# Patient Record
Sex: Male | Born: 1990 | Race: White | Hispanic: No | Marital: Single | Smoking: Never smoker
Health system: Southern US, Community
[De-identification: ages and names within clinical notes are randomized; demographics above are authoritative.]

---

## 2011-03-02 ENCOUNTER — Emergency Department (HOSPITAL_COMMUNITY)
Admission: EM | Admit: 2011-03-02 | Discharge: 2011-03-02 | Disposition: A | Discharge status: Home / Self Care | Payer: Medicaid Other | Attending: Emergency Medicine | Admitting: Emergency Medicine

## 2011-03-02 DIAGNOSIS — K089 Disorder of teeth and supporting structures, unspecified: Secondary | ICD-10-CM | POA: Insufficient documentation

## 2011-03-22 ENCOUNTER — Emergency Department (HOSPITAL_COMMUNITY)
Admission: EM | Admit: 2011-03-22 | Discharge: 2011-03-22 | Disposition: A | Discharge status: Home / Self Care | Payer: Self-pay | Attending: Emergency Medicine | Admitting: Emergency Medicine

## 2011-03-22 DIAGNOSIS — K089 Disorder of teeth and supporting structures, unspecified: Secondary | ICD-10-CM | POA: Insufficient documentation

## 2014-03-17 ENCOUNTER — Emergency Department: Payer: Self-pay | Admitting: Emergency Medicine

## 2015-06-20 ENCOUNTER — Ambulatory Visit
Admission: EM | Admit: 2015-06-20 | Discharge: 2015-06-20 | Disposition: A | Discharge status: Home / Self Care | Payer: Worker's Compensation | Attending: Family Medicine | Admitting: Family Medicine

## 2015-06-20 ENCOUNTER — Ambulatory Visit (INDEPENDENT_AMBULATORY_CARE_PROVIDER_SITE_OTHER): Payer: Worker's Compensation

## 2015-06-20 DIAGNOSIS — S93402A Sprain of unspecified ligament of left ankle, initial encounter: Secondary | ICD-10-CM

## 2015-06-20 DIAGNOSIS — M79662 Pain in left lower leg: Secondary | ICD-10-CM | POA: Diagnosis not present

## 2015-06-20 MED ORDER — MELOXICAM 15 MG PO TABS: 15.0000 mg | ORAL_TABLET | Freq: Every day | ORAL | Status: AC | PRN

## 2015-06-20 MED ORDER — TRAMADOL HCL 50 MG PO TABS: 50.0000 mg | ORAL_TABLET | Freq: Three times a day (TID) | ORAL | Status: AC | PRN

## 2015-06-20 NOTE — ED Notes (Signed)
CAM walker applied as ordered.WCB paperwork given to patient

## 2015-06-20 NOTE — ED Notes (Signed)
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 + old bruising left outer foot/ankle

## 2015-06-20 NOTE — Discharge Instructions (Signed)
Take medication as prescribed. Use splint. Rest. Apply ice. Avoid strenuous activity.   As discussed, follow up with Tommie Maximiano Coss this coming week. See above to call.   Follow up with orthopedic as needed for continued pain as discussed. Return to Urgent care for new or worsening concerns.   Ankle Sprain An ankle sprain is an injury to the strong, fibrous tissues (ligaments) that hold the bones of your ankle joint together.  CAUSES An ankle sprain is usually caused by a fall or by twisting your ankle. Ankle sprains most commonly occur when you step on the outer edge of your foot, and your ankle turns inward. People who participate in sports are more prone to these types of injuries.  SYMPTOMS   Pain in your ankle. The pain may be present at rest or only when you are trying to stand or walk.  Swelling.  Bruising. Bruising may develop immediately or within 1 to 2 days after your injury.  Difficulty standing or walking, particularly when turning corners or changing directions. DIAGNOSIS  Your caregiver will ask you details about your injury and perform a physical exam of your ankle to determine if you have an ankle sprain. During the physical exam, your caregiver will press on and apply pressure to specific areas of your foot and ankle. Your caregiver will try to move your ankle in certain ways. An X-ray exam may be done to be sure a bone was not broken or a ligament did not separate from one of the bones in your ankle (avulsion fracture).  TREATMENT  Certain types of braces can help stabilize your ankle. Your caregiver can make a recommendation for this. Your caregiver may recommend the use of medicine for pain. If your sprain is severe, your caregiver may refer you to a surgeon who helps to restore function to parts of your skeletal system (orthopedist) or a physical therapist. HOME CARE INSTRUCTIONS   Apply ice to your injury for 1-2 days or as directed by your caregiver. Applying ice  helps to reduce inflammation and pain.  Put ice in a plastic bag.  Place a towel between your skin and the bag.  Leave the ice on for 15-20 minutes at a time, every 2 hours while you are awake.  Only take over-the-counter or prescription medicines for pain, discomfort, or fever as directed by your caregiver.  Elevate your injured ankle above the level of your heart as much as possible for 2-3 days.  If your caregiver recommends crutches, use them as instructed. Gradually put weight on the affected ankle. Continue to use crutches or a cane until you can walk without feeling pain in your ankle.  If you have a plaster splint, wear the splint as directed by your caregiver. Do not rest it on anything harder than a pillow for the first 24 hours. Do not put weight on it. Do not get it wet. You may take it off to take a shower or bath.  You may have been given an elastic bandage to wear around your ankle to provide support. If the elastic bandage is too tight (you have numbness or tingling in your foot or your foot becomes cold and blue), adjust the bandage to make it comfortable.  If you have an air splint, you may blow more air into it or let air out to make it more comfortable. You may take your splint off at night and before taking a shower or bath. Wiggle your toes in the splint  several times per day to decrease swelling. SEEK MEDICAL CARE IF:   You have rapidly increasing bruising or swelling.  Your toes feel extremely cold or you lose feeling in your foot.  Your pain is not relieved with medicine. SEEK IMMEDIATE MEDICAL CARE IF:  Your toes are numb or blue.  You have severe pain that is increasing. MAKE SURE YOU:   Understand these instructions.  Will watch your condition.  Will get help right away if you are not doing well or get worse.   This information is not intended to replace advice given to you by your health care provider. Make sure you discuss any questions you have  with your health care provider.   Document Released: 06/21/2005 Document Revised: 07/12/2014 Document Reviewed: 07/03/2011 Elsevier Interactive Patient Education [employer]

## 2015-06-20 NOTE — ED Provider Notes (Signed)
[facility]  ____________________________________________  Time seen: Approximately 12:10 PM  I have reviewed the triage vital signs and the nursing notes.   HISTORY  Chief Complaint Ankle Pain   HPI Daniel Rowe is a 24 y.o. male presents for the complaint of left lateral foot and ankle pain. Patient reports that this pain has been present for 1 week after an injury at work. Patient reports that he slipped off of a forklift while getting down and rolled left ankle. Denies head injury or loss consciousness. Denies neck or back pain or injury.  Patient reports that he has not been seen for this problem until today. Patient states that he tried to rest it at home to see if it would improve and has not improved which is why he is now seeking treatment. Patient reports that he has continued to walk even though with some pain. Patient states that current pain is 5 out of 10 aching. Patient states worse with movement and walking. Denies pain radiation. Also reports pain in back of ankle with pushing off to walk. States still able to fully move foot and ankle. Denies numbness or tingling sensation.  Denies head injury or loss of consciousness. Denies chest pain, shortness of breath, neck or back pain, or other extremity pain or injury.   States he frequently has to do a lot of moving, lifting and strenuous activity at work.  Reports this is a worker's compensation injury, reports he has several days off before next work shift.    History reviewed. No pertinent past medical history.  There are no active problems to display for this patient.   History reviewed. No pertinent past surgical history.  Current Outpatient Rx  Name  Route  Sig  Dispense  Refill  .           Marland Kitchen.             Allergies Review of patient's allergies indicates no known allergies.  History reviewed. No pertinent family history.  Social History Social History  Substance Use Topics  . Smoking status:  Never Smoker   . Smokeless tobacco: None  . Alcohol Use: No    Review of Systems Constitutional: No fever/chills Eyes: No visual changes. ENT: No sore throat. Cardiovascular: Denies chest pain. Respiratory: Denies shortness of breath. Gastrointestinal: No abdominal pain.  No nausea, no vomiting.  No diarrhea.  No constipation. Genitourinary: Negative for dysuria. Musculoskeletal: Negative for back pain. Left foot and ankle pain.  Skin: Negative for rash. Neurological: Negative for headaches, focal weakness or numbness.  10-point ROS otherwise negative.  ____________________________________________   PHYSICAL EXAM:  VITAL SIGNS: ED Triage Vitals  Enc Vitals Group     BP 06/20/15 1035 129/66 mmHg     Pulse Rate 06/20/15 1035 102 Recheck 94     Resp 06/20/15 1035 16     Temp 06/20/15 1035 98.2 F (36.8 C)     Temp Source 06/20/15 1035 Tympanic     SpO2 06/20/15 1035 100 %     Weight 06/20/15 1035 182 lb (82.555 kg)     Height 06/20/15 1035 5\' 10"  (1.778 m)     Head Cir --      Peak Flow --      Pain Score 06/20/15 1037 8     Pain Loc --      Pain Edu? --      Excl. in GC? --     Constitutional: Alert and oriented. Well appearing and in  no acute distress. Eyes: Conjunctivae are normal. PERRL. EOMI. Head: Atraumatic.No ecchymosis or tenderness.  Nose: No congestion/rhinnorhea.  Mouth/Throat: Mucous membranes are moist.  Oropharynx non-erythematous. Neck: No stridor.  No cervical spine tenderness to palpation. Cardiovascular: Normal rate, regular rhythm. Grossly normal heart sounds.  Good peripheral circulation. Respiratory: Normal respiratory effort.  No retractions. Lungs CTAB.  Gastrointestinal: Soft and nontender. = Musculoskeletal: No lower or upper extremity tenderness nor edema.  Bilateral pedal pulses equal and easily palpated.  No cervical, thoracic or lumbar tenderness to palpation. Except: Left lateral ankle mild to moderate tenderness to palpation, mild  tenderness to palpation left posterior ankle, mild healing ecchymosis to left lateral foot, full range of motion present to foot and ankle however with pain with ankle rotation and left foot dorsiflexion, ambulatory with mild antalgic gait, no pain when standing and going to tiptoes, negative Thompson's squeeze test bilaterally, cap refill less than 2 seconds to left distal toes, no sensation or motor deficit, left lower extremity otherwise nontender. Neurologic:  Normal speech and language. No gross focal neurologic deficits are appreciated. No gait instability. Skin:  Skin is warm, dry and intact. No rash noted. Psychiatric: Mood and affect are normal. Speech and behavior are normal.  ____________________________________________   LABS (all labs ordered are listed, but only abnormal results are displayed)  Labs Reviewed - No data to display __________________________________________  RADIOLOGY  EXAM: LEFT TIBIA AND FIBULA - 2 VIEW  COMPARISON: None.  FINDINGS: There is no evidence of fracture or other focal bone lesions. Soft tissues are unremarkable.  IMPRESSION: Negative.   Electronically Signed By: Marlan Palau M.D. On: 06/20/2015 11:54          DG Foot Complete Left (Final result) Result time: 06/20/15 11:55:19   Final result by Rad Results In Interface (06/20/15 11:55:19)   Narrative:   CLINICAL DATA: Injury. Leg pain and foot pain  EXAM: LEFT FOOT - COMPLETE 3+ VIEW  COMPARISON: None.  FINDINGS: There is no evidence of fracture or dislocation. There is no evidence of arthropathy or other focal bone abnormality. Soft tissues are unremarkable.  IMPRESSION: Negative.   Electronically Signed By: Marlan Palau M.D. On: 06/20/2015 11:55       I, Renford Dills, personally viewed and evaluated these images (plain radiographs) as part of my medical decision making.    Procedure:  Orthopedic walking boot applied by RN.  Neurovascular intact post application.  INITIAL IMPRESSION / ASSESSMENT AND PLAN / ED COURSE  Pertinent labs & imaging results that were available during my care of the patient were reviewed by me and considered in my medical decision making (see chart for details).  Very well-appearing patient. No acute distress. Presents for the complaint of left foot and ankle pain post mechanical injury at work this past week. Denies head injury or loss consciousness. Denies other pain or injury. Reports has tried to rest at home to see if pain resolves, but reports pain did not improve and presented today. 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 Complains of left lateral ankle and foot pain. Will evaluate by x-ray.  Left tibia fibula x-ray as well as left foot x-ray reviewed and negative. X-rays negative per radiologist.  Suspect strain injury. As patient has continued to remain ambulatory but with pain will apply walking boot for support and stability. Patient denies need for crutches. As patient does frequent lifting and moving heavy or items at work, concern that patient not may not be back to full physical ability  by time of his next shift. Recommend patient to apply ice, rest and elevate. Also will treat with when necessary Mobic and tramadol as needed for breakthrough pain. Discussed with patient in room, will refer patient to Kela Millin, FNP for further follow-up. Discussed patient to follow-up with Kela Millin prior to resuming full activity and also to help determine if orthopedic follow-up as indicated. Patient verbalized understanding and agreed to this plan and reports that he will follow-up with Kela Millin this coming week, information given. Patient to continue using walking boot and avoid strenuous activity involving left lower extremity until follow up.   Discussed follow up with Primary care physician this week. Discussed follow up and return parameters including no  resolution or any worsening concerns. Patient verbalized understanding and agreed to plan.  .____________________________________________   FINAL CLINICAL IMPRESSION(S) / ED DIAGNOSES  Final diagnoses:  Left ankle sprain, initial encounter  Pain of left lower leg       Renford Dills, NP 06/25/15 1611  Renford Dills, NP 06/25/15 1636

## 2016-11-10 IMAGING — CR DG FOOT COMPLETE 3+V*L*
4 series · 4 of 4 positions shown · non-contrast
Comparison: None.

CLINICAL DATA: Injury.  Leg pain and foot pain

EXAM:
LEFT FOOT - COMPLETE 3+ VIEW

[foot ap]
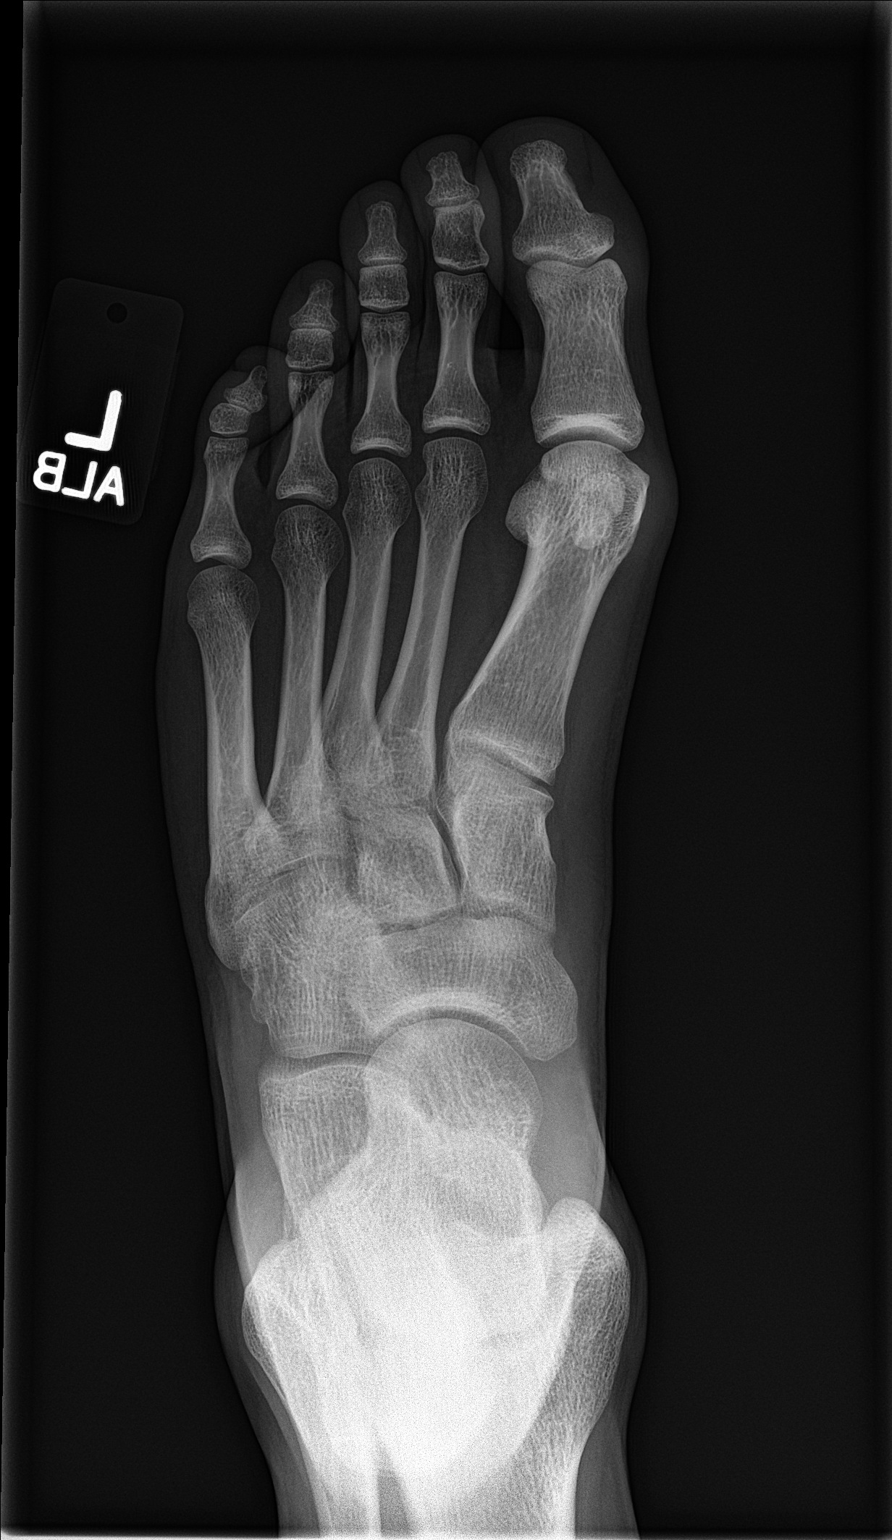

[foot obl]
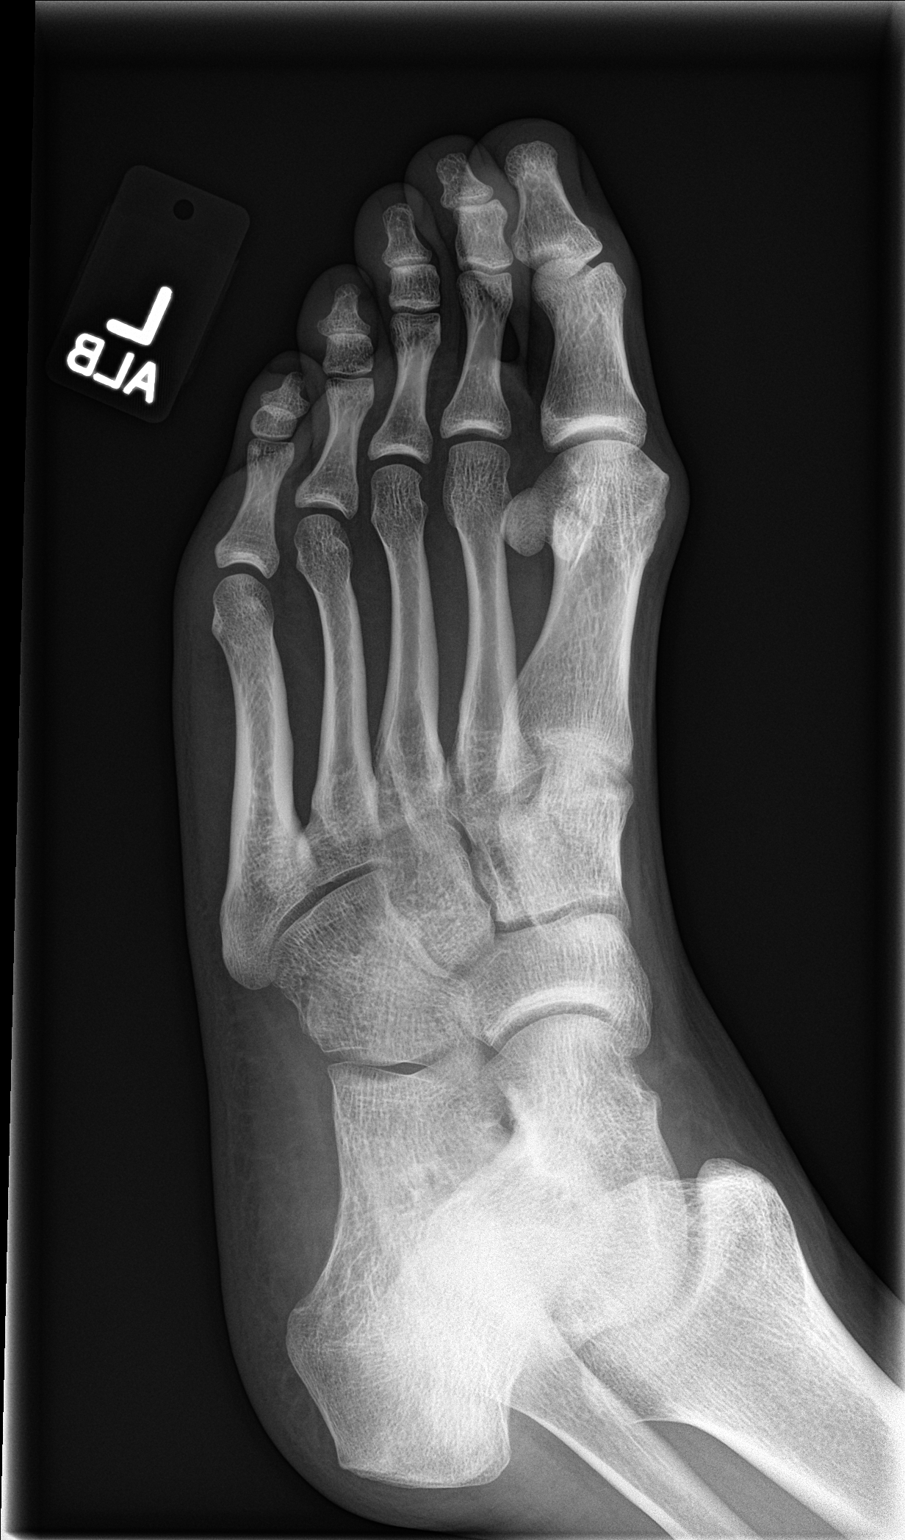

[foot lat (1 of 2)]
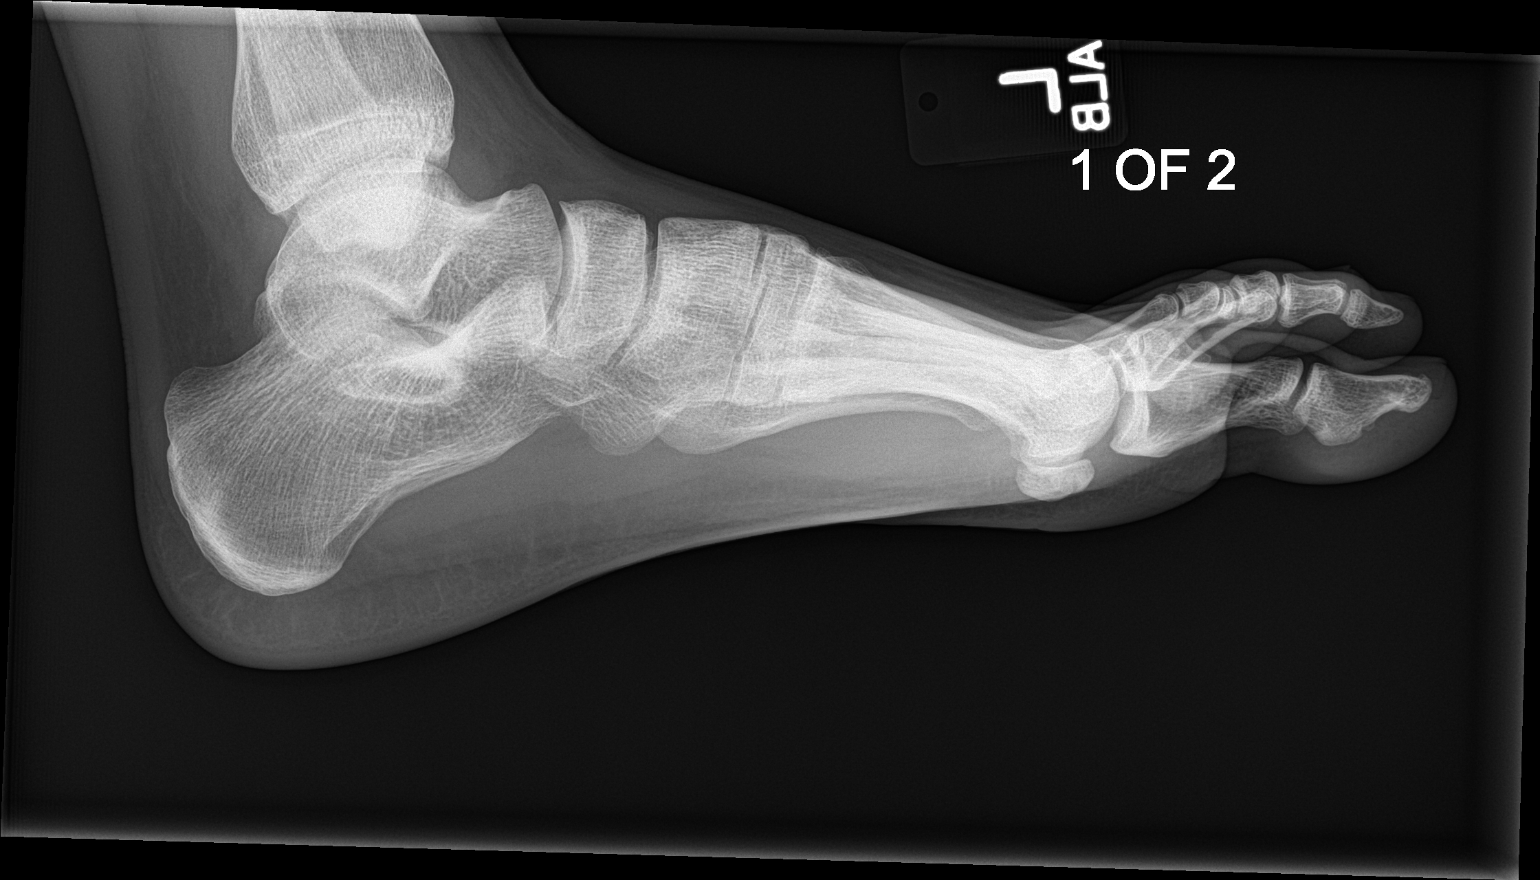

[foot lat (2 of 2)]
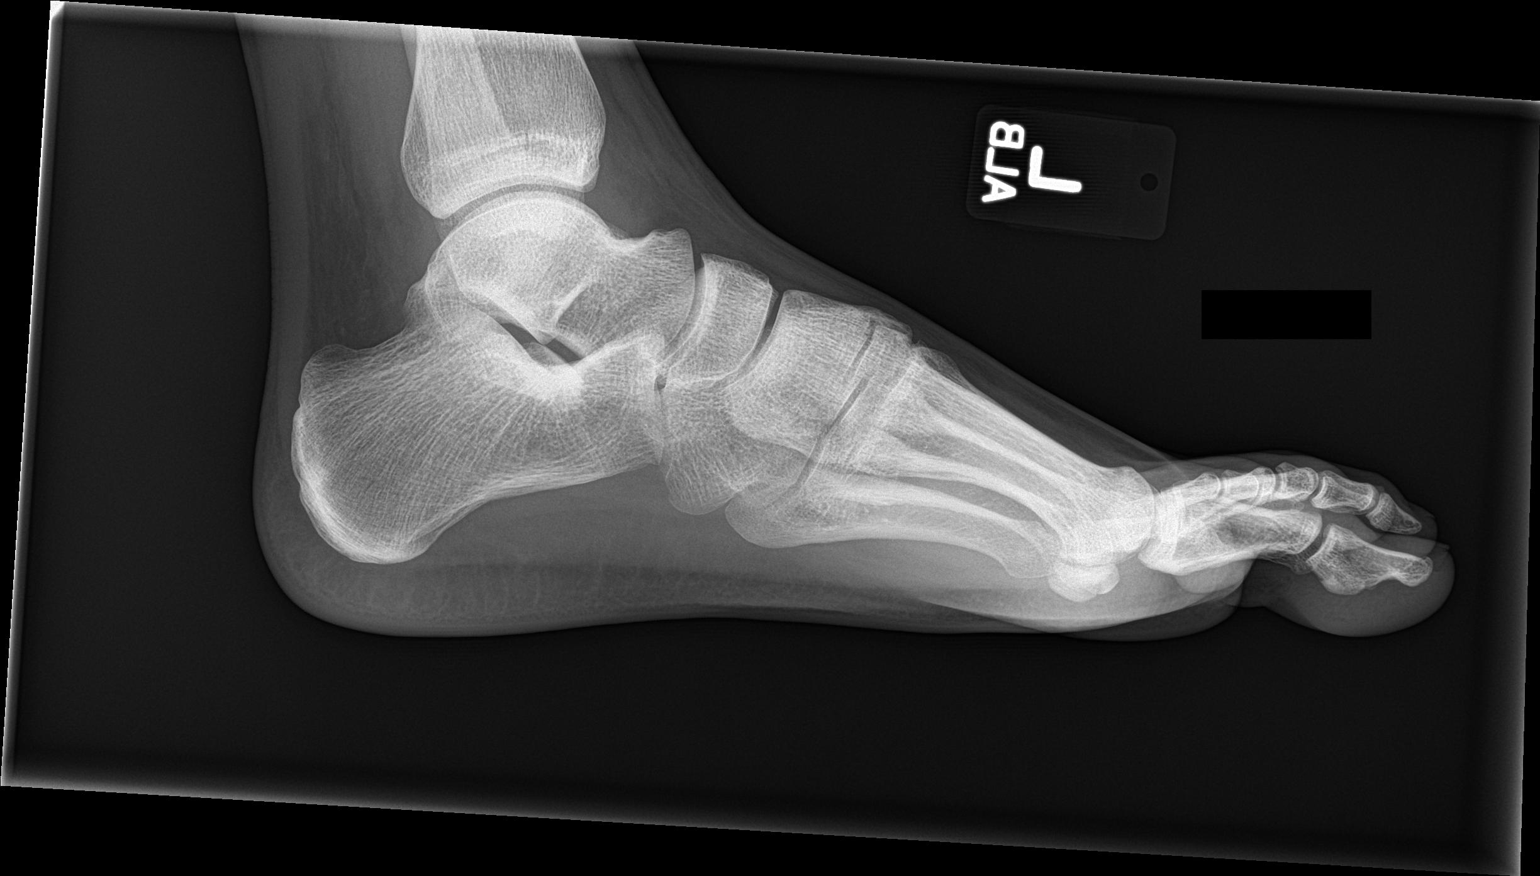

[4 of 4 positions shown; findings below may reference images not displayed]

FINDINGS: There is no evidence of fracture or dislocation. There is no
evidence of arthropathy or other focal bone abnormality. Soft
tissues are unremarkable.
IMPRESSION: Negative.

## 2016-11-10 IMAGING — CR DG TIBIA/FIBULA 2V*L*
2 series · 2 of 2 positions shown · non-contrast
Comparison: None.

CLINICAL DATA: Injury.  Leg pain

EXAM:
LEFT TIBIA AND FIBULA - 2 VIEW

[tibia ap]
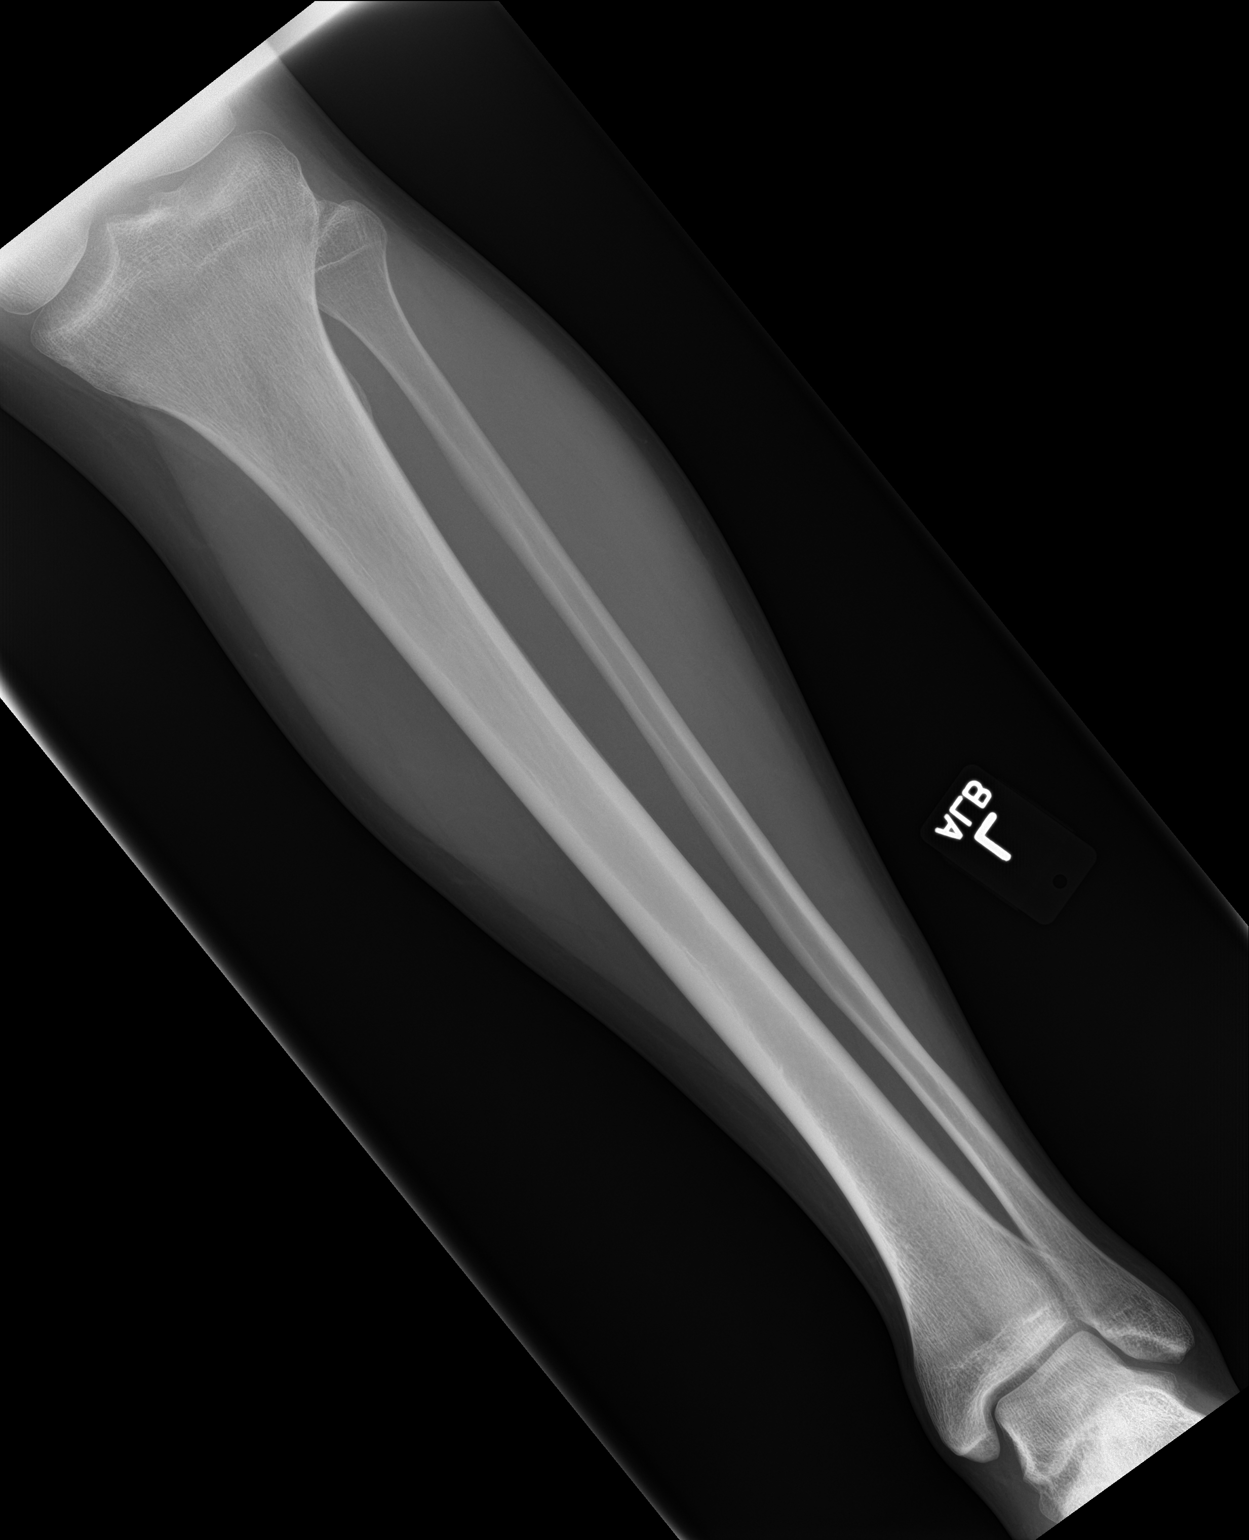

[tibia lat]
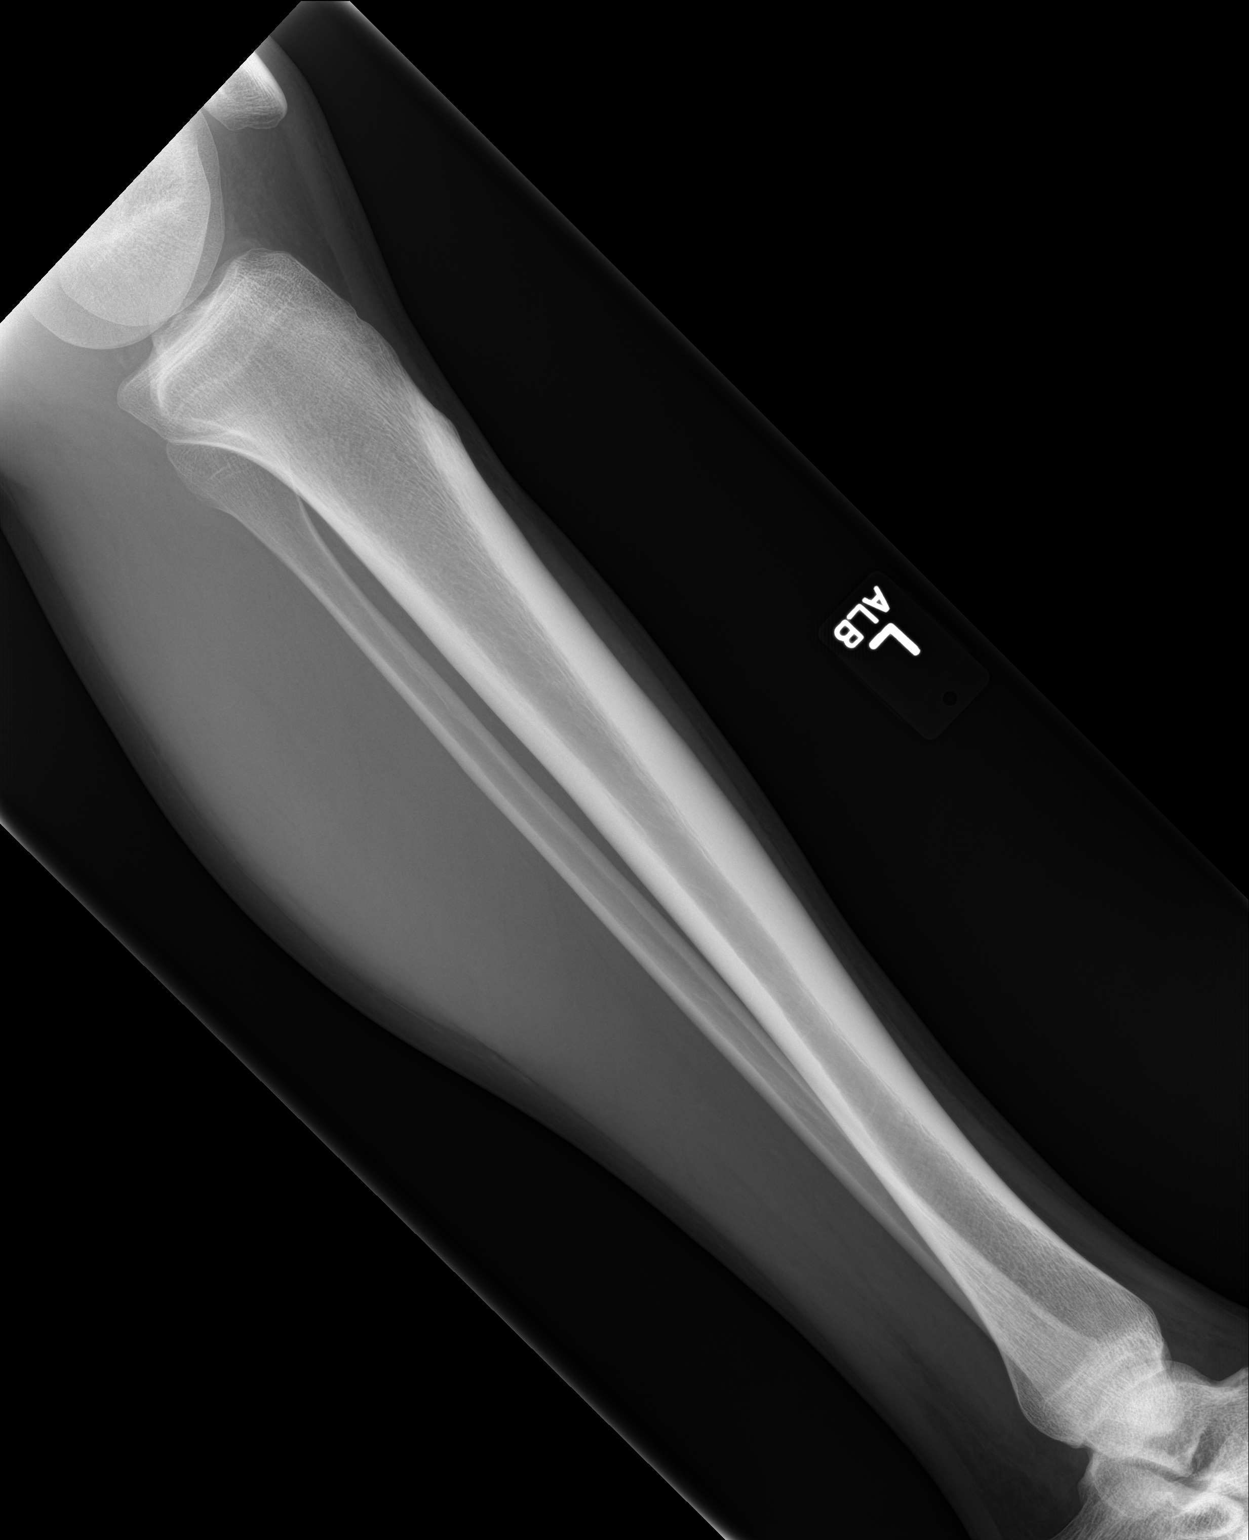

[2 of 2 positions shown; findings below may reference images not displayed]

FINDINGS: There is no evidence of fracture or other focal bone lesions. Soft
tissues are unremarkable.
IMPRESSION: Negative.
